# Patient Record
Sex: Male | Born: 1965 | Race: Black or African American | Marital: Single | Smoking: Current every day smoker
Health system: Southern US, Community
[De-identification: ages and names within clinical notes are randomized; demographics above are authoritative.]

## PROBLEM LIST (undated history)

## (undated) DIAGNOSIS — F419 Anxiety disorder, unspecified: Secondary | ICD-10-CM

## (undated) DIAGNOSIS — C61 Malignant neoplasm of prostate: Secondary | ICD-10-CM

## (undated) DIAGNOSIS — H269 Unspecified cataract: Secondary | ICD-10-CM

## (undated) DIAGNOSIS — C801 Malignant (primary) neoplasm, unspecified: Secondary | ICD-10-CM

## (undated) DIAGNOSIS — F1010 Alcohol abuse, uncomplicated: Secondary | ICD-10-CM

## (undated) DIAGNOSIS — F32A Depression, unspecified: Secondary | ICD-10-CM

## (undated) DIAGNOSIS — M1990 Unspecified osteoarthritis, unspecified site: Secondary | ICD-10-CM

## (undated) HISTORY — DX: Unspecified osteoarthritis, unspecified site: M19.90

## (undated) HISTORY — DX: Unspecified cataract: H26.9

## (undated) HISTORY — DX: Malignant neoplasm of prostate: C61

## (undated) HISTORY — PX: APPENDECTOMY: SHX54

## (undated) HISTORY — DX: Alcohol abuse, uncomplicated: F10.10

## (undated) HISTORY — DX: Anxiety disorder, unspecified: F41.9

## (undated) HISTORY — DX: Malignant (primary) neoplasm, unspecified: C80.1

## (undated) HISTORY — PX: HERNIA REPAIR: SHX51

## (undated) HISTORY — DX: Depression, unspecified: F32.A

---

## 2017-05-26 ENCOUNTER — Encounter (HOSPITAL_COMMUNITY): Payer: Self-pay

## 2017-05-28 ENCOUNTER — Other Ambulatory Visit (HOSPITAL_COMMUNITY): Payer: Self-pay | Admitting: Foot & Ankle Surgery

## 2017-05-28 DIAGNOSIS — I739 Peripheral vascular disease, unspecified: Secondary | ICD-10-CM

## 2017-05-29 ENCOUNTER — Ambulatory Visit (HOSPITAL_COMMUNITY)
Admission: RE | Admit: 2017-05-29 | Discharge: 2017-05-29 | Disposition: A | Discharge status: Home / Self Care | Payer: Managed Care, Other (non HMO) | Source: Ambulatory Visit | Attending: Cardiology | Admitting: Cardiology

## 2017-05-29 DIAGNOSIS — I739 Peripheral vascular disease, unspecified: Secondary | ICD-10-CM | POA: Diagnosis not present

## 2017-07-08 ENCOUNTER — Encounter: Payer: Self-pay | Admitting: Cardiovascular Disease

## 2017-07-08 ENCOUNTER — Ambulatory Visit: Payer: Managed Care, Other (non HMO) | Admitting: Cardiovascular Disease

## 2017-07-08 VITALS — BP 148/82 | HR 86 | Ht 71.0 in | Wt 137.0 lb

## 2017-07-08 DIAGNOSIS — M79671 Pain in right foot: Secondary | ICD-10-CM | POA: Diagnosis not present

## 2017-07-08 DIAGNOSIS — M79672 Pain in left foot: Secondary | ICD-10-CM

## 2017-07-08 DIAGNOSIS — Z13220 Encounter for screening for lipoid disorders: Secondary | ICD-10-CM

## 2017-07-08 LAB — HEPATIC FUNCTION PANEL
ALK PHOS: 64 IU/L (ref 39–117)
ALT: 39 IU/L (ref 0–44)
AST: 36 IU/L (ref 0–40)
Albumin: 4.3 g/dL (ref 3.5–5.5)
Bilirubin Total: 0.6 mg/dL (ref 0.0–1.2)
Bilirubin, Direct: 0.2 mg/dL (ref 0.00–0.40)
Total Protein: 7.6 g/dL (ref 6.0–8.5)

## 2017-07-08 LAB — LIPID PANEL
Chol/HDL Ratio: 2.1 ratio (ref 0.0–5.0)
Cholesterol, Total: 210 mg/dL — ABNORMAL HIGH (ref 100–199)
HDL: 100 mg/dL (ref >39)
LDL Calculated: 95 mg/dL (ref 0–99)
TRIGLYCERIDES: 74 mg/dL (ref 0–149)
VLDL Cholesterol Cal: 15 mg/dL (ref 5–40)

## 2017-07-08 NOTE — Progress Notes (Signed)
07/08/2017 Glenn Daniel   [birth year]  [id number]  Primary Physician Patient, No Pcp Per Primary Cardiologist: Lorretta Harp MD Garret Reddish, [place], [place]  HPI:  Glenn Daniel is a 52 y.o. engaged in appearing African-American male father of one daughter, grandfather 3 grandchildren who works as a Glass blower/designer and was referred to me by Dr. Melony Overly , his podiatrist, for foot pain to rule out a peripheral vascular etiology.  He has no cardiac risk factors other than 35-pack-year tobacco abuse.  He does drink 3 shots of liquor at night along with a sixpack of beer.  There is no family history.  He is never had a heart attack or stroke and denies chest pain or shortness of breath.  He does have bilateral foot pain which he attributes to arthritis and plantar fasciitis.  Recent lower extremity arterial Doppler studies performed in the office 06/02/2017 revealed normal ABIs with triphasic waveforms.   No outpatient medications have been marked as taking for the 07/08/17 encounter (Office Visit) with Lorretta Harp, MD.     No Known Allergies  Social History   Socioeconomic History  . Marital status: Single    Spouse name: Not on file  . Number of children: Not on file  . Years of education: Not on file  . Highest education level: Not on file  Occupational History  . Not on file  Social Needs  . Financial resource strain: Not on file  . Food insecurity:    Worry: Not on file    Inability: Not on file  . Transportation needs:    Medical: Not on file    Non-medical: Not on file  Tobacco Use  . Smoking status: Current Every Day Smoker    Packs/day: 0.75    Years: 36.00    Pack years: 27.00    Types: Cigarettes  . Smokeless tobacco: Former Network engineer and Sexual Activity  . Alcohol use: Not on file  . Drug use: Not on file  . Sexual activity: Not on file  Lifestyle  . Physical activity:    Days per week: Not on file    Minutes per session: Not on file  . Stress: Not on  file  Relationships  . Social connections:    Talks on phone: Not on file    Gets together: Not on file    Attends religious service: Not on file    Active member of club or organization: Not on file    Attends meetings of clubs or organizations: Not on file    Relationship status: Not on file  . Intimate partner violence:    Fear of current or ex partner: Not on file    Emotionally abused: Not on file    Physically abused: Not on file    Forced sexual activity: Not on file  Other Topics Concern  . Not on file  Social History Narrative  . Not on file     Review of Systems: General: negative for chills, fever, night sweats or weight changes.  Cardiovascular: negative for chest pain, dyspnea on exertion, edema, orthopnea, palpitations, paroxysmal nocturnal dyspnea or shortness of breath Dermatological: negative for rash Respiratory: negative for cough or wheezing Urologic: negative for hematuria Abdominal: negative for nausea, vomiting, diarrhea, bright red blood per rectum, melena, or hematemesis Neurologic: negative for visual changes, syncope, or dizziness All other systems reviewed and are otherwise negative except as noted above.    Blood pressure (!) 148/82, pulse 86,  height 5\' 11"  (1.803 m), weight 137 lb (62.1 kg).  General appearance: alert and no distress Neck: no adenopathy, no carotid bruit, no JVD, supple, symmetrical, trachea midline and thyroid not enlarged, symmetric, no tenderness/mass/nodules Lungs: clear to auscultation bilaterally Heart: regular rate and rhythm, S1, S2 normal, no murmur, click, rub or gallop Extremities: extremities normal, atraumatic, no cyanosis or edema Pulses: 2+ and symmetric Skin: Skin color, texture, turgor normal. No rashes or lesions Neurologic: Alert and oriented X 3, normal strength and tone. Normal symmetric reflexes. Normal coordination and gait  EKG sinus rhythm 86 with voltage criteria for left ventricular hypertrophy.  I  personally reviewed this EKG.  ASSESSMENT AND PLAN:   Bilateral foot pain Mr. Schwandt was referred to me by Dr. Melony Overly for [facility] evaluation because of bilateral foot pain.  He apparently has arthritis and plantar fasciitis.  He had lower extremity Dopplers performed 06/02/2017 that were entirely normal and he has bounding pedal pulses.  I do not think his pain is vascular in nature.  I will see him back as needed.      Lorretta Harp MD FACP,FACC,FAHA, [facility] 07/08/2017 10:13 AM

## 2017-07-08 NOTE — Assessment & Plan Note (Signed)
Mr. Glenn Daniel was referred to me by Dr. Melony Overly for [facility] evaluation because of bilateral foot pain.  He apparently has arthritis and plantar fasciitis.  He had lower extremity Dopplers performed 06/02/2017 that were entirely normal and he has bounding pedal pulses.  I do not think his pain is vascular in nature.  I will see him back as needed.

## 2017-07-08 NOTE — Patient Instructions (Signed)
Medication Instructions: Your physician recommends that you continue on your current medications as directed. Please refer to the Current Medication list given to you today.  Labwork: Your physician recommends that you return for a FASTING lipid profile and hepatic function panel today.   Follow-Up: Your physician recommends that you schedule a follow-up appointment as needed with Dr. Gwenlyn Found.

## 2017-07-14 ENCOUNTER — Encounter: Payer: Self-pay | Admitting: *Deleted

## 2017-07-15 ENCOUNTER — Encounter: Payer: Self-pay | Admitting: Cardiovascular Disease

## 2017-12-30 ENCOUNTER — Other Ambulatory Visit: Payer: Self-pay | Admitting: Radiation Oncology

## 2017-12-30 DIAGNOSIS — C61 Malignant neoplasm of prostate: Secondary | ICD-10-CM

## 2018-01-11 ENCOUNTER — Ambulatory Visit
Admission: RE | Admit: 2018-01-11 | Discharge: 2018-01-11 | Disposition: A | Discharge status: Home / Self Care | Payer: 59 | Source: Ambulatory Visit | Attending: Radiation Oncology | Admitting: Radiation Oncology

## 2018-01-11 DIAGNOSIS — C61 Malignant neoplasm of prostate: Secondary | ICD-10-CM

## 2018-01-11 MED ORDER — GADOBENATE DIMEGLUMINE 529 MG/ML IV SOLN
15.0000 mL | Freq: Once | INTRAVENOUS | Status: AC | PRN
  Administered 2018-01-11: 15 mL via INTRAVENOUS

## 2019-12-27 IMAGING — MR MR PROSTATE WO/W CM
56 series · 56 of 56 positions shown · IV contrast (Multihance 15ml)
Comparison: None.

CLINICAL DATA: Elevated PSA. Patient reports history of prostate
cancer. No documentation available.

EXAM:
MR PROSTATE WITHOUT AND WITH CONTRAST
TECHNIQUE: Multiplanar multisequence MRI images were obtained of the pelvis
centered about the prostate. Pre and post contrast images were
obtained.
CONTRAST:  15mL MULTIHANCE GADOBENATE DIMEGLUMINE 529 MG/ML IV SOLN

[Series 3: T1 · axial · 8.0mm · 1.06mm/px · 1 of 32 slices shown (1 of 2)]
[im 1/32]
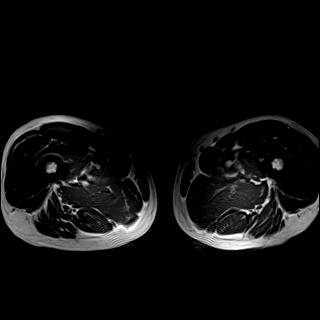

[Series 4: bSSFP fat-sat · axial · 8.0mm · 0.74mm/px · 1 of 32 slices shown]
[im 1/32]
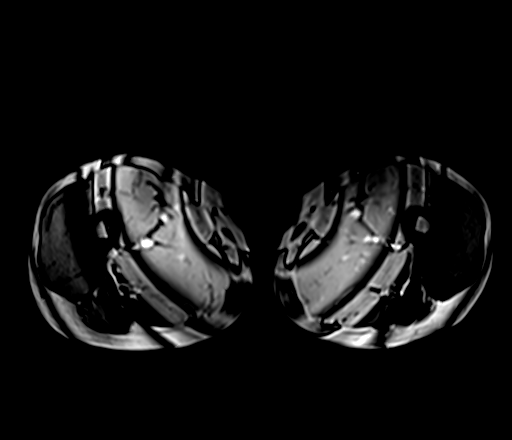

[Series 5: T2 · sagittal · 3.5mm · 0.56mm/px · 1 of 45 slices shown (1 of 4)]
[im 1/45]
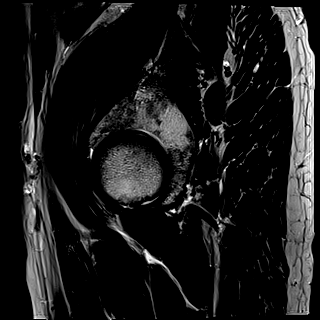

[Series 7: T1 · axial · 3.0mm · 0.31mm/px · 1 of 42 slices shown (2 of 2)]
[im 1/42]
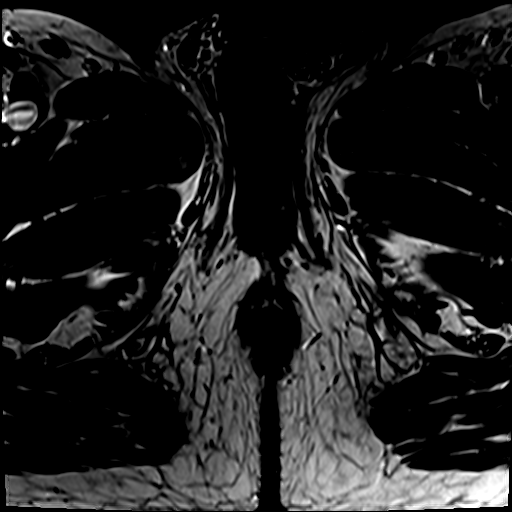

[Series 8: T2 · axial · 3.0mm · 0.56mm/px · 1 of 42 slices shown (2 of 4)]
[im 1/42]
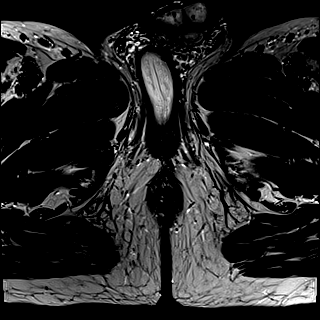

[Series 9: T2 · axial · 1.0mm · 1.04mm/px · 1 of 112 slices shown (3 of 4)]
[im 1/112]
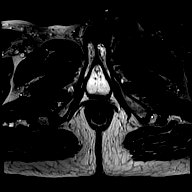

[Series 10: T2 · coronal · 3.5mm · 0.56mm/px · 1 of 45 slices shown (4 of 4)]
[im 1/45]
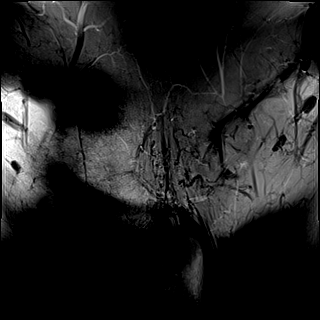

[Series 11: DWI · axial · 3.5mm · 1.56mm/px · 1 of 89 slices shown (1 of 2)]
[im 1/89]
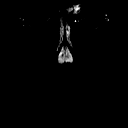

[Series 12: DWI · axial · 3.5mm · 1.56mm/px · 1 of 30 slices shown (2 of 2)]
[im 1/30]
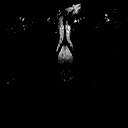

[Series 13: pre t1_twist_tra_dyn_ttc=8.1s · axial · non-contrast · 3.5mm · 0.83mm/px · 1 of 30 slices shown]
[im 1/30]
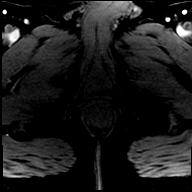

[Series 14: post t1_twist_tra_dyn-copy center · axial · 3.5mm · 0.83mm/px · 1 of 30 slices shown (1 of 24)]
[im 1/30]
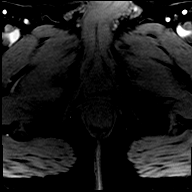

[Series 15: post t1_twist_tra_dyn-copy center · axial · 3.5mm · 0.83mm/px · 1 of 30 slices shown (2 of 24)]
[im 1/30]
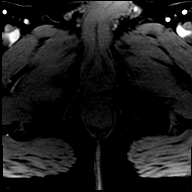

[Series 16: post t1_twist_tra_dyn-copy cent_sub_ttc=(id) · axial · 3.5mm · 0.83mm/px · 1 of 30 slices shown (1 of 22)]
[im 1/30]
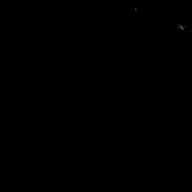

[Series 17: post t1_twist_tra_dyn-copy center · axial · 3.5mm · 0.83mm/px · 1 of 30 slices shown (3 of 24)]
[im 1/30]
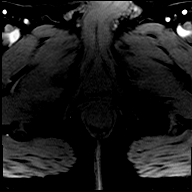

[Series 18: post t1_twist_tra_dyn-copy cent_sub_ttc=(id) · axial · 3.5mm · 0.83mm/px · 1 of 30 slices shown (2 of 22)]
[im 1/30]
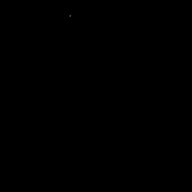

[Series 19: post t1_twist_tra_dyn-copy center · axial · 3.5mm · 0.83mm/px · 1 of 30 slices shown (4 of 24)]
[im 1/30]
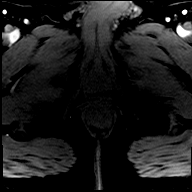

[Series 20: post t1_twist_tra_dyn-copy cent_sub_ttc=(id) · axial · 3.5mm · 0.83mm/px · 1 of 30 slices shown (3 of 22)]
[im 1/30]
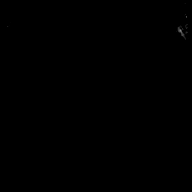

[Series 21: post t1_twist_tra_dyn-copy center · axial · 3.5mm · 0.83mm/px · 1 of 30 slices shown (5 of 24)]
[im 1/30]
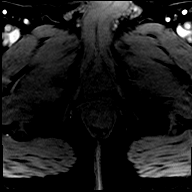

[Series 22: post t1_twist_tra_dyn-copy cent_sub_ttc=(id) · axial · 3.5mm · 0.83mm/px · 1 of 30 slices shown (4 of 22)]
[im 1/30]
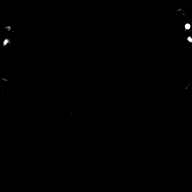

[Series 23: post t1_twist_tra_dyn-copy center · axial · 3.5mm · 0.83mm/px · 1 of 30 slices shown (6 of 24)]
[im 1/30]
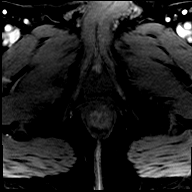

[Series 24: post t1_twist_tra_dyn-copy cent_sub_ttc=(id) · axial · 3.5mm · 0.83mm/px · 1 of 30 slices shown (5 of 22)]
[im 1/30]
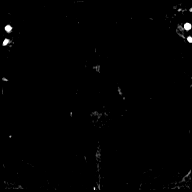

[Series 25: post t1_twist_tra_dyn-copy center · axial · 3.5mm · 0.83mm/px · 1 of 30 slices shown (7 of 24)]
[im 1/30]
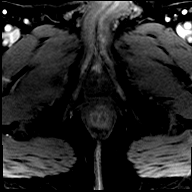

[Series 26: post t1_twist_tra_dyn-copy cent_sub_ttc=(id) · axial · 3.5mm · 0.83mm/px · 1 of 30 slices shown (6 of 22)]
[im 1/30]
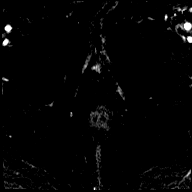

[Series 27: post t1_twist_tra_dyn-copy center · axial · 3.5mm · 0.83mm/px · 1 of 30 slices shown (8 of 24)]
[im 1/30]
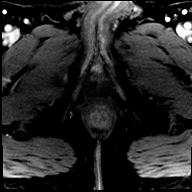

[Series 28: post t1_twist_tra_dyn-copy cent_sub_ttc=(id) · axial · 3.5mm · 0.83mm/px · 1 of 30 slices shown (7 of 22)]
[im 1/30]
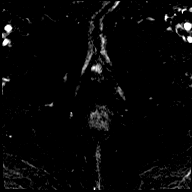

[Series 29: post t1_twist_tra_dyn-copy center · axial · 3.5mm · 0.83mm/px · 1 of 30 slices shown (9 of 24)]
[im 1/30]
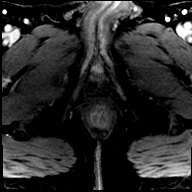

[Series 30: post t1_twist_tra_dyn-copy cent_sub_ttc=(id) · axial · 3.5mm · 0.83mm/px · 1 of 30 slices shown (8 of 22)]
[im 1/30]
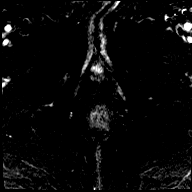

[Series 31: post t1_twist_tra_dyn-copy center · axial · 3.5mm · 0.83mm/px · 1 of 30 slices shown (10 of 24)]
[im 1/30]
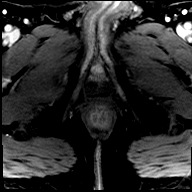

[Series 32: post t1_twist_tra_dyn-copy cent_sub_ttc=(id) · axial · 3.5mm · 0.83mm/px · 1 of 30 slices shown (9 of 22)]
[im 1/30]
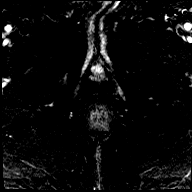

[Series 33: post t1_twist_tra_dyn-copy center · axial · 3.5mm · 0.83mm/px · 1 of 30 slices shown (11 of 24)]
[im 1/30]
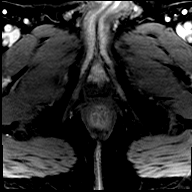

[Series 34: post t1_twist_tra_dyn-copy cent_sub_ttc=(id) · axial · 3.5mm · 0.83mm/px · 1 of 30 slices shown (10 of 22)]
[im 1/30]
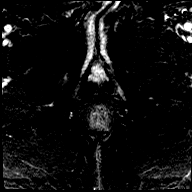

[Series 35: post t1_twist_tra_dyn-copy center · axial · 3.5mm · 0.83mm/px · 1 of 30 slices shown (12 of 24)]
[im 1/30]
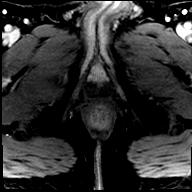

[Series 36: post t1_twist_tra_dyn-copy cent_sub_ttc=(id) · axial · 3.5mm · 0.83mm/px · 1 of 30 slices shown (11 of 22)]
[im 1/30]
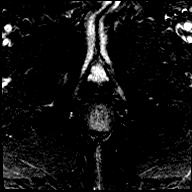

[Series 37: post t1_twist_tra_dyn-copy center · axial · 3.5mm · 0.83mm/px · 1 of 30 slices shown (13 of 24)]
[im 1/30]
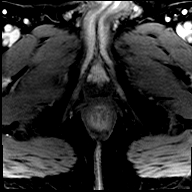

[Series 38: post t1_twist_tra_dyn-copy cent_sub_ttc=(id) · axial · 3.5mm · 0.83mm/px · 1 of 30 slices shown (12 of 22)]
[im 1/30]
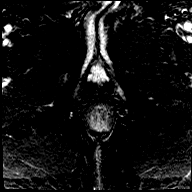

[Series 39: post t1_twist_tra_dyn-copy center · axial · 3.5mm · 0.83mm/px · 1 of 30 slices shown (14 of 24)]
[im 1/30]
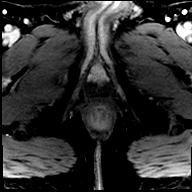

[Series 40: post t1_twist_tra_dyn-copy cent_sub_ttc=(id) · axial · 3.5mm · 0.83mm/px · 1 of 30 slices shown (13 of 22)]
[im 1/30]
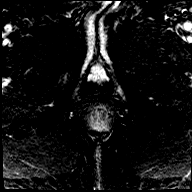

[Series 41: post t1_twist_tra_dyn-copy center · axial · 3.5mm · 0.83mm/px · 1 of 30 slices shown (15 of 24)]
[im 1/30]
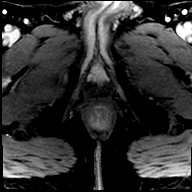

[Series 42: post t1_twist_tra_dyn-copy cent_sub_ttc=(id) · axial · 3.5mm · 0.83mm/px · 1 of 30 slices shown (14 of 22)]
[im 1/30]
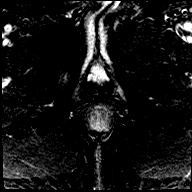

[Series 43: post t1_twist_tra_dyn-copy center · axial · 3.5mm · 0.83mm/px · 1 of 30 slices shown (16 of 24)]
[im 1/30]
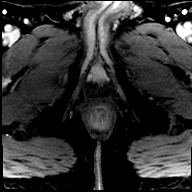

[Series 44: post t1_twist_tra_dyn-copy cent_sub_ttc=(id) · axial · 3.5mm · 0.83mm/px · 1 of 30 slices shown (15 of 22)]
[im 1/30]
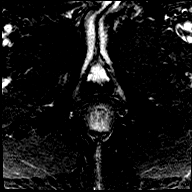

[Series 45: post t1_twist_tra_dyn-copy center · axial · 3.5mm · 0.83mm/px · 1 of 30 slices shown (17 of 24)]
[im 1/30]
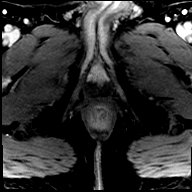

[Series 46: post t1_twist_tra_dyn-copy cent_sub_ttc=(id) · axial · 3.5mm · 0.83mm/px · 1 of 30 slices shown (16 of 22)]
[im 1/30]
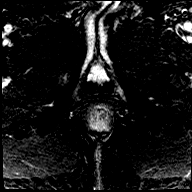

[Series 47: post t1_twist_tra_dyn-copy center · axial · 3.5mm · 0.83mm/px · 1 of 30 slices shown (18 of 24)]
[im 1/30]
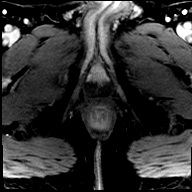

[Series 48: post t1_twist_tra_dyn-copy cent_sub_ttc=(id) · axial · 3.5mm · 0.83mm/px · 1 of 30 slices shown (17 of 22)]
[im 1/30]
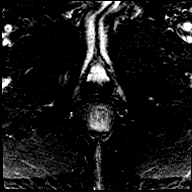

[Series 49: post t1_twist_tra_dyn-copy center · axial · 3.5mm · 0.83mm/px · 1 of 30 slices shown (19 of 24)]
[im 1/30]
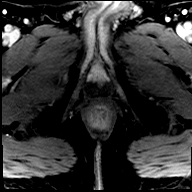

[Series 50: post t1_twist_tra_dyn-copy cent_sub_ttc=(id) · axial · 3.5mm · 0.83mm/px · 1 of 30 slices shown (18 of 22)]
[im 1/30]
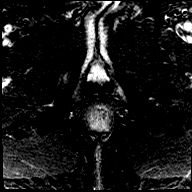

[Series 51: post t1_twist_tra_dyn-copy center · axial · 3.5mm · 0.83mm/px · 1 of 30 slices shown (20 of 24)]
[im 1/30]
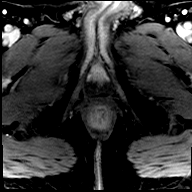

[Series 52: post t1_twist_tra_dyn-copy cent_sub_ttc=(id) · axial · 3.5mm · 0.83mm/px · 1 of 30 slices shown (19 of 22)]
[im 1/30]
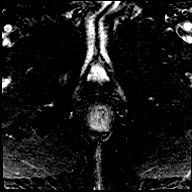

[Series 53: post t1_twist_tra_dyn-copy center · axial · 3.5mm · 0.83mm/px · 1 of 30 slices shown (21 of 24)]
[im 1/30]
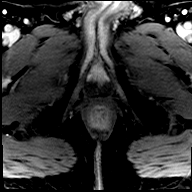

[Series 54: post t1_twist_tra_dyn-copy cent_sub_ttc=(id) · axial · 3.5mm · 0.83mm/px · 1 of 30 slices shown (20 of 22)]
[im 1/30]
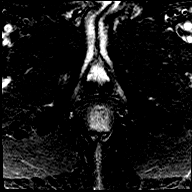

[Series 55: post t1_twist_tra_dyn-copy center · axial · 3.5mm · 0.83mm/px · 1 of 30 slices shown (22 of 24)]
[im 1/30]
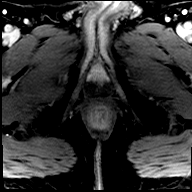

[Series 56: post t1_twist_tra_dyn-copy cent_sub_ttc=(id) · axial · 3.5mm · 0.83mm/px · 1 of 30 slices shown (21 of 22)]
[im 1/30]
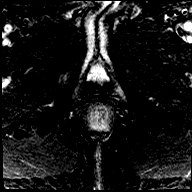

[Series 57: post t1_twist_tra_dyn-copy center · axial · 3.5mm · 0.83mm/px · 1 of 30 slices shown (23 of 24)]
[im 1/30]
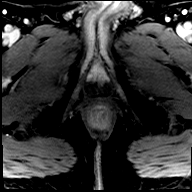

[Series 58: post t1_twist_tra_dyn-copy cent_sub_ttc=(id) · axial · 3.5mm · 0.83mm/px · 1 of 30 slices shown (22 of 22)]
[im 1/30]
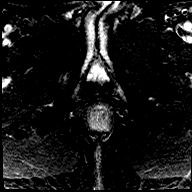

[Series 59: post t1_twist_tra_dyn-copy center · axial · 3.5mm · 0.83mm/px · 1 of 30 slices shown (24 of 24)]
[im 1/30]
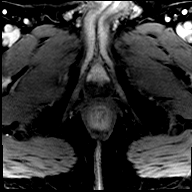

[56 of 56 positions shown; findings below may reference images not displayed]

FINDINGS: Prostate: No foci of restricted diffusion within the peripheral
zone. Heterogeneous signal intensity within peripheral zone on T2
weighted imaging without focal lesion. No suspicious enhancement
pattern on postcontrast imaging.

The transitional zone is relatively small with capsulated nodules.

Seminal vesicles are normal.  Prostatic capsule intact.

Volume: 4.4 x 2.5 x 4.3 cm (volume = 25 cm^3)

Transcapsular spread:  Absent

Seminal vesicle involvement: Absent

Neurovascular bundle involvement: Absent

Pelvic adenopathy: Absent

Bone metastasis: Absent

Other findings: None
IMPRESSION: 1. No high-grade carcinoma within the peripheral zone.
2. Mildly nodular transitional zone.

## 2021-07-24 ENCOUNTER — Encounter: Payer: Self-pay | Admitting: Physician Assistant

## 2021-08-09 ENCOUNTER — Encounter: Payer: Self-pay | Admitting: *Deleted

## 2021-08-14 NOTE — Progress Notes (Signed)
08/16/2021 Glenn Daniel [id number] 1965-09-16  Referring provider: Dr. Jannette Fogo  Primary GI doctor: Dr. Loletha Carrow  ASSESSMENT AND PLAN:   Alternating constipation and diarrhea with some BRB rare, small volume Will schedule for colonoscopy We have discussed the risks of bleeding, infection, perforation, medication reactions, and remote risk of death associated with colonoscopy. All questions were answered and the patient acknowledges these risk and wishes to proceed.  Gastroesophageal reflux disease without esophagitis Rare but with alcohol abuse and GERD with some "choking"  Will schedule EGD to evaluate for gastritis esophagitis varices.  I discussed risks of EGD with patient today, including risk of sedation, bleeding or perforation.  Patient provides understanding and gave verbal consent to proceed.  Alcohol abuse Will get labs to evaluate LFTs/CBC Counseled on ETOH cessation, informaiton given May benefit from RUQ [place]  Patient Care Team: [facility] as PCP - General (Pediatrics)  HISTORY OF PRESENT ILLNESS: 56 y.o. male with a past medical history of alcohol abuse, smoker, arthritis, prostate cancer a/p radiation therapy 2019, CKD stage 3, and others listed below presents for evaluation of constipation and discuss colonoscopy.   Has never had colonoscopy.  No known GI malignancy that he knows of.  He has intermittent constipation/diarrhea, noticed changes in bowel's after radiation treatment for prostate cancer 2019.  Has BM daily or up to 3-4 x a day but will have straining/hard stools.  Right after treatment was having fecal incontinence, this has improved some but still can have some stool with urinating.  He has BRB on TP and in toilet with straining.  He has choking during the day and at night, states it is form his left ear and has cough.  He has reflux.  He has a lot of bloating/gas, no swelling in AB or legs.  He drinks to sleep for  years, 1/2 a pint, no history of withdraws. No family history of cirrhosis.  He has lost about 30 lbs but weight is same from 2019, patient states recently weight 160.   Wt Readings from Last 3 Encounters:  08/16/21 136 lb (61.7 kg)  07/08/17 137 lb (62.1 kg)     Current Medications:   Current Outpatient Medications (Endocrine & Metabolic):    dapagliflozin propanediol (FARXIGA) 10 MG TABS tablet, Take by mouth daily.  Current Outpatient Medications (Cardiovascular):    tadalafil (CIALIS) 20 MG tablet, Take 20 mg by mouth daily.  Current Outpatient Medications (Respiratory):    diphenhydrAMINE (BENADRYL) 25 MG tablet, Take 25 mg by mouth every 6 (six) hours as needed.   Current Outpatient Medications (Hematological):    folic acid (FOLVITE) 426 MCG tablet, Take 400 mcg by mouth daily.  Current Outpatient Medications (Other):    AMBULATORY NON FORMULARY MEDICATION, Take 1 capsule by mouth daily. One a day men   ergocalciferol (VITAMIN D2) 1.25 MG (50000 UT) capsule, Take 50,000 Units by mouth once a week.   NON FORMULARY, Take 1 1e11 Vector Genomes by mouth 2 (two) times daily. Enteresto 24 mg  Medical History:  Past Medical History:  Diagnosis Date   Alcohol abuse    Arthritis    Cancer ([place])    Cataract    Depression    Prostate cancer ([place])    Allergies: No Known Allergies   Surgical History:  He  has a past surgical history that includes Appendectomy and Hernia repair. Family History:  His family history includes Diabetes in his father; Prostate cancer in his brother. Social  History:   reports that he has been smoking cigarettes. He has a 27.00 pack-year smoking history. He has quit using smokeless tobacco. No history on file for alcohol use and drug use.  REVIEW OF SYSTEMS  : All other systems reviewed and negative except where noted in the History of Present Illness.   PHYSICAL EXAM: BP 126/70   Pulse (!) 105   Ht '5\' 11"'$  (1.803 m)   Wt 136 lb (61.7 kg)    BMI 18.97 kg/m  General:   Pleasant, skinny male in no acute distress Head:   Normocephalic and atraumatic. Eyes:  sclerae anicteric,conjunctive pink  Heart:   regular rate and rhythm Pulm:  Clear anteriorly; no wheezing Abdomen:   Soft, Flat AB, Active bowel sounds. mild tenderness in the epigastrium. Without guarding and Without rebound, No organomegaly appreciated. Rectal: Not evaluated Extremities:  Without edema. Msk: Symmetrical without gross deformities. Peripheral pulses intact.  Neurologic:  Alert and  oriented x4;  No focal deficits.  Skin:   Dry and intact without significant lesions or rashes. Psychiatric:  Cooperative. Normal mood and affect.    Vladimir Crofts, PA-C 3:04 PM

## 2021-08-16 ENCOUNTER — Ambulatory Visit (INDEPENDENT_AMBULATORY_CARE_PROVIDER_SITE_OTHER): Payer: 59 | Admitting: Physician Assistant

## 2021-08-16 ENCOUNTER — Other Ambulatory Visit (INDEPENDENT_AMBULATORY_CARE_PROVIDER_SITE_OTHER): Payer: 59

## 2021-08-16 ENCOUNTER — Encounter: Payer: Self-pay | Admitting: Physician Assistant

## 2021-08-16 VITALS — BP 126/70 | HR 105 | Ht 71.0 in | Wt 136.0 lb

## 2021-08-16 DIAGNOSIS — K219 Gastro-esophageal reflux disease without esophagitis: Secondary | ICD-10-CM | POA: Diagnosis not present

## 2021-08-16 DIAGNOSIS — K625 Hemorrhage of anus and rectum: Secondary | ICD-10-CM

## 2021-08-16 DIAGNOSIS — R198 Other specified symptoms and signs involving the digestive system and abdomen: Secondary | ICD-10-CM | POA: Diagnosis not present

## 2021-08-16 DIAGNOSIS — F1010 Alcohol abuse, uncomplicated: Secondary | ICD-10-CM | POA: Diagnosis not present

## 2021-08-16 LAB — COMPREHENSIVE METABOLIC PANEL
ALT: 33 U/L (ref 0–53)
AST: 31 U/L (ref 0–37)
Albumin: 3.3 g/dL — ABNORMAL LOW (ref 3.5–5.2)
Alkaline Phosphatase: 59 U/L (ref 39–117)
BUN: 12 mg/dL (ref 6–23)
CO2: 26 mEq/L (ref 19–32)
Calcium: 8.9 mg/dL (ref 8.4–10.5)
Chloride: 106 mEq/L (ref 96–112)
Creatinine, Ser: 1.13 mg/dL (ref 0.40–1.50)
GFR: 72.53 mL/min (ref >60.00)
Glucose, Bld: 104 mg/dL — ABNORMAL HIGH (ref 70–99)
Potassium: 4 mEq/L (ref 3.5–5.1)
Sodium: 140 mEq/L (ref 135–145)
Total Bilirubin: 0.5 mg/dL (ref 0.2–1.2)
Total Protein: 7 g/dL (ref 6.0–8.3)

## 2021-08-16 LAB — CBC WITH DIFFERENTIAL/PLATELET
Basophils Absolute: 0 10*3/uL (ref 0.0–0.1)
Basophils Relative: 1 % (ref 0.0–3.0)
Eosinophils Absolute: 0 10*3/uL (ref 0.0–0.7)
Eosinophils Relative: 0.7 % (ref 0.0–5.0)
HCT: 37.1 % — ABNORMAL LOW (ref 39.0–52.0)
Hemoglobin: 12.5 g/dL — ABNORMAL LOW (ref 13.0–17.0)
Lymphocytes Relative: 31.7 % (ref 12.0–46.0)
Lymphs Abs: 1 10*3/uL (ref 0.7–4.0)
MCHC: 33.7 g/dL (ref 30.0–36.0)
MCV: 103.3 fl — ABNORMAL HIGH (ref 78.0–100.0)
Monocytes Absolute: 0.4 10*3/uL (ref 0.1–1.0)
Monocytes Relative: 13.2 % — ABNORMAL HIGH (ref 3.0–12.0)
Neutro Abs: 1.6 10*3/uL (ref 1.4–7.7)
Neutrophils Relative %: 53.4 % (ref 43.0–77.0)
Platelets: 191 10*3/uL (ref 150.0–400.0)
RBC: 3.59 Mil/uL — ABNORMAL LOW (ref 4.22–5.81)
RDW: 14 % (ref 11.5–15.5)
WBC: 3.1 10*3/uL — ABNORMAL LOW (ref 4.0–10.5)

## 2021-08-16 MED ORDER — SUPREP BOWEL PREP KIT 17.5-3.13-1.6 GM/177ML PO SOLN: 1.0000 | ORAL | 0 refills | Status: DC

## 2021-08-16 NOTE — Patient Instructions (Addendum)
You have been scheduled for an endoscopy and colonoscopy. Please follow the written instructions given to you at your visit today. Please pick up your prep supplies at the pharmacy within the next 1-3 days. If you use inhalers (even only as needed), please bring them with you on the day of your procedure.  Your provider has requested that you go to the basement level for lab work before leaving today. Press "B" on the elevator. The lab is located at the first door on the left as you exit the elevator.  If you are age 66 or older, your body mass index should be between 23-30. Your Body mass index is 18.97 kg/m. If this is out of the aforementioned range listed, please consider follow up with your Primary Care Provider.  If you are age 58 or younger, your body mass index should be between 19-25. Your Body mass index is 18.97 kg/m. If this is out of the aformentioned range listed, please consider follow up with your Primary Care Provider.   ________________________________________________________  The [facility] providers would like to encourage you to use [facility] to communicate with providers for non-urgent requests or questions.  Due to long hold times on the telephone, sending your provider a message by [facility] may be a faster and more efficient way to get a response.  Please allow 48 business hours for a response.  Please remember that this is for non-urgent requests.  _______________________________________________________  Due to recent changes in healthcare laws, you may see the results of your imaging and laboratory studies on MyChart before your provider has had a chance to review them.  We understand that in some cases there may be results that are confusing or concerning to you. Not all laboratory results come back in the same time frame and the provider may be waiting for multiple results in order to interpret others.  Please give [place] 48 hours in order for your provider to thoroughly review  all the results before contacting the office for clarification of your results.  ________________________________________________________  FIBER SUPPLEMENT You can do metamucil or fibercon once or twice a day but if this causes gas/bloating please switch to Benefiber or Citracel.  Fiber is good for constipation/diarrhea/irritable bowel syndrome.  It can also help with weight loss and can help lower your bad cholesterol.  Please do 1 TBSP in the morning in water, coffee, or tea. It can take up to a month before you can see a difference with your bowel movements.  It is cheapest from [employer], [employer], [employer].   Take with fiber with with a full 8 oz glass of water once a day. This can take 1 month to start helping, so try for at least one month.  Recommend increasing water and activity.  Get a squatty potty to use at home or try a stool, goal is to get your knees above your hips during a bowel movement.  - Drink at least 64-80 ounces of water/liquid per day. - Establish a time to try to move your bowels every day.  For many people, this is after a cup of coffee or after a meal such as breakfast. - Sit all of the way back on the toilet keeping your back fairly straight and while sitting up, try to rest the tops of your forearms on your upper thighs.   - Raising your feet with a step stool/squatty potty can be helpful to improve the angle that allows your stool to pass through the rectum. -  Relax the rectum feeling it bulge toward the toilet water.  If you feel your rectum raising toward your body, you are contracting rather than relaxing. - Breathe in and slowly exhale. "Belly breath" by expanding your belly towards your belly button. Keep belly expanded as you gently direct pressure down and back to the anus.  A low pitched GRRR sound can assist with increasing intra-abdominal pressure.  - Repeat 3-4 times. If unsuccessful, contract the pelvic floor to restore normal tone and get off the toilet.   Avoid excessive straining. - To reduce excessive wiping by teaching your anus to normally contract, place hands on outer aspect of knees and resist knee movement outward.  Hold 5-10 second then place hands just inside of knees and resist inward movement of knees.  Hold 5 seconds.  Repeat a few times each way.  Go to the ER if unable to pass gas, severe AB pain, unable to hold down food, any shortness of breath of chest pain.    FODMAP stands for fermentable oligo-, di-, mono-saccharides and polyols (1). These are the scientific terms used to classify groups of carbs that are notorious for triggering digestive symptoms like bloating, gas and stomach pain.   FODMAPs are found in a wide range of foods in varying amounts. Some foods contain just one type, while others contain several.  The main dietary sources of the four groups of FODMAPs include:  Oligosaccharides: Wheat, rye, legumes and various fruits and vegetables, such as garlic and onions.  Disaccharides: Milk, yogurt and soft cheese. Lactose is the main carb.  Monosaccharides: Various fruit including figs and mangoes, and sweeteners such as honey and agave nectar. Fructose is the main carb.  Polyols: Certain fruits and vegetables including blackberries and lychee, as well as some low-calorie sweeteners like those in sugar-free gum.   Keep a food diary. This will help you identify foods that cause symptoms. Write down: What you eat and when. What symptoms you have. When symptoms occur in relation to your meals. Avoid foods that cause symptoms. Talk with your dietitian about other ways to get the same nutrients that are in these foods. Eat your meals slowly, in a relaxed setting. Aim to eat 5-6 small meals per day. Do not skip meals. Drink enough fluids to keep your urine clear or pale yellow. If dairy products cause your symptoms to flare up, try eating less of them. You might be able to handle yogurt better than other dairy products  because it contains bacteria that help with digestion.

## 2021-08-19 ENCOUNTER — Other Ambulatory Visit: Payer: Self-pay

## 2021-08-19 DIAGNOSIS — D539 Nutritional anemia, unspecified: Secondary | ICD-10-CM

## 2021-08-19 NOTE — Progress Notes (Signed)
____________________________________________________________  Attending physician addendum:  Thank you for sending this case to me. I have reviewed the entire note and agree with the plan.   Coy Vandoren Danis, MD  ____________________________________________________________  

## 2021-08-21 ENCOUNTER — Other Ambulatory Visit (INDEPENDENT_AMBULATORY_CARE_PROVIDER_SITE_OTHER): Payer: 59

## 2021-08-21 DIAGNOSIS — D539 Nutritional anemia, unspecified: Secondary | ICD-10-CM

## 2021-08-21 DIAGNOSIS — R7989 Other specified abnormal findings of blood chemistry: Secondary | ICD-10-CM

## 2021-08-21 LAB — IBC + FERRITIN
Ferritin: >1500 ng/mL — ABNORMAL HIGH (ref 22.0–322.0)
Iron: 198 ug/dL — ABNORMAL HIGH (ref 42–165)
Saturation Ratios: 89.5 % — ABNORMAL HIGH (ref 20.0–50.0)
TIBC: 221.2 ug/dL — ABNORMAL LOW (ref 250.0–450.0)
Transferrin: 158 mg/dL — ABNORMAL LOW (ref 212.0–360.0)

## 2021-08-21 LAB — VITAMIN B12: Vitamin B-12: 842 pg/mL (ref 211–911)

## 2021-08-23 ENCOUNTER — Other Ambulatory Visit: Payer: Self-pay

## 2021-08-28 ENCOUNTER — Other Ambulatory Visit: Payer: 59

## 2021-08-28 DIAGNOSIS — R7989 Other specified abnormal findings of blood chemistry: Secondary | ICD-10-CM

## 2021-09-05 LAB — HEMOCHROMATOSIS DNA-PCR(C282Y,H63D)

## 2021-09-29 ENCOUNTER — Encounter: Payer: Self-pay | Admitting: Certified Registered Nurse Anesthetist

## 2021-10-01 ENCOUNTER — Encounter: Payer: Self-pay | Admitting: Gastroenterology

## 2021-10-01 ENCOUNTER — Ambulatory Visit (AMBULATORY_SURGERY_CENTER): Payer: 59 | Admitting: Gastroenterology

## 2021-10-01 VITALS — BP 130/79 | HR 76 | Temp 97.1°F | Resp 20 | Ht 71.0 in | Wt 136.0 lb

## 2021-10-01 DIAGNOSIS — K319 Disease of stomach and duodenum, unspecified: Secondary | ICD-10-CM | POA: Diagnosis not present

## 2021-10-01 DIAGNOSIS — D122 Benign neoplasm of ascending colon: Secondary | ICD-10-CM

## 2021-10-01 DIAGNOSIS — D123 Benign neoplasm of transverse colon: Secondary | ICD-10-CM | POA: Diagnosis not present

## 2021-10-01 DIAGNOSIS — K2960 Other gastritis without bleeding: Secondary | ICD-10-CM | POA: Diagnosis not present

## 2021-10-01 DIAGNOSIS — R110 Nausea: Secondary | ICD-10-CM | POA: Diagnosis not present

## 2021-10-01 DIAGNOSIS — D120 Benign neoplasm of cecum: Secondary | ICD-10-CM

## 2021-10-01 DIAGNOSIS — K625 Hemorrhage of anus and rectum: Secondary | ICD-10-CM | POA: Diagnosis not present

## 2021-10-01 DIAGNOSIS — R1084 Generalized abdominal pain: Secondary | ICD-10-CM

## 2021-10-01 DIAGNOSIS — K219 Gastro-esophageal reflux disease without esophagitis: Secondary | ICD-10-CM

## 2021-10-01 DIAGNOSIS — K635 Polyp of colon: Secondary | ICD-10-CM | POA: Diagnosis not present

## 2021-10-01 MED ORDER — SODIUM CHLORIDE 0.9 % IV SOLN: 500.0000 mL | Freq: Once | INTRAVENOUS | Status: DC

## 2021-10-01 NOTE — Progress Notes (Signed)
2060 Robinul 0.1 mg IV given due large amount of secretions upon assessment.  MD made aware, vss

## 2021-10-01 NOTE — Op Note (Signed)
[name] Patient Name: Glenn Daniel Procedure Date: 10/01/2021 8:31 AM MRN: [id number] Endoscopist: Mallie Mussel L. Loletha Carrow , MD Age: 56 Referring MD:  Date of Birth: 1966/01/28 Gender: Male Account #: [id number] Procedure:                Upper GI endoscopy Indications:              Abdominal bloating, Nausea Medicines:                Monitored Anesthesia Care Procedure:                Pre-Anesthesia Assessment:                           - Prior to the procedure, a History and Physical                            was performed, and patient medications and                            allergies were reviewed. The patient's tolerance of                            previous anesthesia was also reviewed. The risks                            and benefits of the procedure and the sedation                            options and risks were discussed with the patient.                            All questions were answered, and informed consent                            was obtained. Prior Anticoagulants: The patient has                            taken no previous anticoagulant or antiplatelet                            agents. ASA Grade Assessment: II - A patient with                            mild systemic disease. After reviewing the risks                            and benefits, the patient was deemed in                            satisfactory condition to undergo the procedure.                           After obtaining informed consent, the endoscope was  passed under direct vision. Throughout the                            procedure, the patient's blood pressure, pulse, and                            oxygen saturations were monitored continuously. The                            GIF [id number] #[id number] was introduced through the                            mouth, and advanced to the second part of duodenum.                            The upper GI endoscopy was  accomplished without                            difficulty. The patient tolerated the procedure                            well. Scope In: Scope Out: Findings:                 The esophagus was normal.                           Diffuse congested mucosa was found in the gastric                            fundus and in the gastric body. Several biopsies                            were obtained in the gastric body and in the                            gastric antrum with cold forceps for histology.                           The cardia and gastric fundus were normal on                            retroflexion.                           The examined duodenum was normal. Complications:            No immediate complications. Estimated Blood Loss:     Estimated blood loss was minimal. Impression:               - Normal esophagus.                           - Congestive gastropathy.                           - Normal examined duodenum.                           -  Several biopsies were obtained in the gastric                            body and in the gastric antrum. Recommendation:           - Patient has a contact number available for                            emergencies. The signs and symptoms of potential                            delayed complications were discussed with the                            patient. Return to normal activities tomorrow.                            Written discharge instructions were provided to the                            patient.                           - Resume previous diet.                           - Continue present medications.                           - Await pathology results.                           - See the other procedure note for documentation of                            additional recommendations.                           - Decrease alcohol consumption. Cassady Stanczak L. Loletha Carrow, MD 10/01/2021 9:16:57 AM This report has been signed  electronically.

## 2021-10-01 NOTE — Progress Notes (Signed)
Called to room to assist during endoscopic procedure.  Patient ID and intended procedure confirmed with present staff. Received instructions for my participation in the procedure from the performing physician.  

## 2021-10-01 NOTE — Patient Instructions (Signed)
Please read handouts provided. Continue present medications. Await pathology results. Decrease alcohol consumption. Increase dietary fiber.   YOU HAD AN ENDOSCOPIC PROCEDURE TODAY AT [facility]:   Refer to the procedure report that was given to you for any specific questions about what was found during the examination.  If the procedure report does not answer your questions, please call your gastroenterologist to clarify.  If you requested that your care partner not be given the details of your procedure findings, then the procedure report has been included in a sealed envelope for you to review at your convenience later.  YOU SHOULD EXPECT: Some feelings of bloating in the abdomen. Passage of more gas than usual.  Walking can help get rid of the air that was put into your GI tract during the procedure and reduce the bloating. If you had a lower endoscopy (such as a colonoscopy or flexible sigmoidoscopy) you may notice spotting of blood in your stool or on the toilet paper. If you underwent a bowel prep for your procedure, you may not have a normal bowel movement for a few days.  Please Note:  You might notice some irritation and congestion in your nose or some drainage.  This is from the oxygen used during your procedure.  There is no need for concern and it should clear up in a day or so.  SYMPTOMS TO REPORT IMMEDIATELY:  Following lower endoscopy (colonoscopy or flexible sigmoidoscopy):  Excessive amounts of blood in the stool  Significant tenderness or worsening of abdominal pains  Swelling of the abdomen that is new, acute  Fever of 100F or higher  Following upper endoscopy (EGD)  Vomiting of blood or coffee ground material  New chest pain or pain under the shoulder blades  Painful or persistently difficult swallowing  New shortness of breath  Fever of 100F or higher  Black, tarry-looking stools  For urgent or emergent issues, a gastroenterologist can be reached  at any hour by calling [phone]. Do not use MyChart messaging for urgent concerns.    DIET:  We do recommend a small meal at first, but then you may proceed to your regular diet.  Drink plenty of fluids but you should avoid alcoholic beverages for 24 hours.  ACTIVITY:  You should plan to take it easy for the rest of today and you should NOT DRIVE or use heavy machinery until tomorrow (because of the sedation medicines used during the test).    FOLLOW UP: Our staff will call the number listed on your records the next business day following your procedure.  We will call around 7:15- 8:00 am to check on you and address any questions or concerns that you may have regarding the information given to you following your procedure. If we do not reach you, we will leave a message.  If you develop any symptoms (ie: fever, flu-like symptoms, shortness of breath, cough etc.) before then, please call [phone].  If you test positive for Covid 19 in the 2 weeks post procedure, please call and report this information to [place].    If any biopsies were taken you will be contacted by phone or by letter within the next 1-3 weeks.  Please call us at [phone] if you have not heard about the biopsies in 3 weeks.    SIGNATURES/CONFIDENTIALITY: You and/or your care partner have signed paperwork which will be entered into your electronic medical record.  These signatures attest to the fact that that the information  above on your After Visit Summary has been reviewed and is understood.  Full responsibility of the confidentiality of this discharge information lies with you and/or your care-partner.  

## 2021-10-01 NOTE — Op Note (Signed)
[facility] Patient Name: Glenn Daniel Procedure Date: 10/01/2021 8:31 AM MRN: [id number] Endoscopist: Mallie Mussel L. Loletha Carrow , MD Age: 56 Referring MD:  Date of Birth: 1965/08/19 Gender: Male Account #: [id number] Procedure:                Colonoscopy Indications:              Rectal bleeding (when bowel irregular), Change in                            bowel habits                           XRT for prostate CA in 2019 Medicines:                Monitored Anesthesia Care Procedure:                Pre-Anesthesia Assessment:                           - Prior to the procedure, a History and Physical                            was performed, and patient medications and                            allergies were reviewed. The patient's tolerance of                            previous anesthesia was also reviewed. The risks                            and benefits of the procedure and the sedation                            options and risks were discussed with the patient.                            All questions were answered, and informed consent                            was obtained. Prior Anticoagulants: The patient has                            taken no previous anticoagulant or antiplatelet                            agents. ASA Grade Assessment: II - A patient with                            mild systemic disease. After reviewing the risks                            and benefits, the patient was deemed in  satisfactory condition to undergo the procedure.                           After obtaining informed consent, the colonoscope                            was passed under direct vision. Throughout the                            procedure, the patient's blood pressure, pulse, and                            oxygen saturations were monitored continuously. The                            [id number] PCF-H190TL Slim SB Colonoscope was introduced                             through the anus and advanced to the the cecum,                            identified by appendiceal orifice and ileocecal                            valve. The colonoscopy was performed without                            difficulty. The patient tolerated the procedure                            well. The quality of the bowel preparation was                            good. The ileocecal valve, appendiceal orifice, and                            rectum were photographed. The bowel preparation                            used was SUPREP. Scope In: 8:36:31 AM Scope Out: 8:54:20 AM Scope Withdrawal Time: 0 hours 15 minutes 48 seconds  Total Procedure Duration: 0 hours 17 minutes 49 seconds  Findings:                 The perianal and digital rectal examinations were                            normal.                           Repeat examination of right colon under NBI                            performed.  A 6 mm polyp was found in the cecum. The polyp was                            semi-sessile. The polyp was removed with a cold                            snare. Resection and retrieval were complete.                           Four sessile and semi-sessile polyps were found in                            the transverse colon and ascending colon. The                            polyps were 2 to 8 mm in size. These polyps were                            removed with a cold snare. Resection and retrieval                            were complete.                           Multiple diverticula were found in the left colon.                           The mucosa vascular pattern in the distal rectum                            was mildly increased anteriorly (mild radiation                            proctopathy - see photos).                           The exam was otherwise without abnormality on                            direct and retroflexion views. Complications:             No immediate complications. Estimated Blood Loss:     Estimated blood loss: none. Estimated blood loss                            was minimal. Impression:               - One 6 mm polyp in the cecum, removed with a cold                            snare. Resected and retrieved.                           - Four 2 to 8 mm polyps in the transverse colon  and                            in the ascending colon, removed with a cold snare.                            Resected and retrieved.                           - Diverticulosis in the left colon.                           - Increased mucosa vascular pattern in the distal                            rectum.                           - The examination was otherwise normal on direct                            and retroflexion views. Recommendation:           - Patient has a contact number available for                            emergencies. The signs and symptoms of potential                            delayed complications were discussed with the                            patient. Return to normal activities tomorrow.                            Written discharge instructions were provided to the                            patient.                           - Resume previous diet.                           - Continue present medications.                           - Await pathology results.                           - Increase dietary fiber for regularity.                           - Repeat colonoscopy is recommended for                            surveillance. The colonoscopy date will be  determined after pathology results from today's                            exam become available for review.                           - See the other procedure note for documentation of                            additional recommendations. Jessie Cowher L. Loletha Carrow, MD 10/01/2021 9:13:46 AM This report has been signed  electronically.

## 2021-10-01 NOTE — Progress Notes (Signed)
855 Ephedrine 10 mg given IV due to low BP, MD updated.

## 2021-10-01 NOTE — Progress Notes (Signed)
Vs in adm by CW

## 2021-10-01 NOTE — Progress Notes (Signed)
History and Physical:  This patient presents for endoscopic testing for: Encounter Diagnoses  Name Primary?   Rectum bleeding Yes   Gastroesophageal reflux disease, unspecified whether esophagitis present    Clinical details in office consult note of 08/16/2021. Chronic reflux symptoms, bloating, nausea, altered bowel habits and occasional small-volume rectal bleeding Patient with daily alcohol use to excess. He also describes some sinus problems with postnasal drip causing morning mucus production, and says he will soon be seeing an ENT physician.  Patient is otherwise without complaints or active issues today.   Past Medical History: Past Medical History:  Diagnosis Date   Alcohol abuse    Anxiety    Arthritis    Cancer ([place])    Cataract    Depression    Prostate cancer [facility])      Past Surgical History: Past Surgical History:  Procedure Laterality Date   APPENDECTOMY     HERNIA REPAIR      Allergies: No Known Allergies  Outpatient Meds: Current Outpatient Medications  Medication Sig Dispense Refill   AMBULATORY NON FORMULARY MEDICATION Take 1 capsule by mouth daily. One a day men     dapagliflozin propanediol (FARXIGA) 10 MG TABS tablet Take by mouth daily.     diphenhydrAMINE (BENADRYL) 25 MG tablet Take 25 mg by mouth every 6 (six) hours as needed.     ergocalciferol (VITAMIN D2) 1.25 MG (50000 UT) capsule Take 50,000 Units by mouth once a week.     folic acid (FOLVITE) 932 MCG tablet Take 400 mcg by mouth daily.     NON FORMULARY Take 1 1e11 Vector Genomes by mouth 2 (two) times daily. Enteresto 24 mg     tadalafil (CIALIS) 20 MG tablet Take 20 mg by mouth daily.     Current Facility-Administered Medications  Medication Dose Route Frequency Provider Last Rate Last Admin   0.9 %  sodium chloride infusion  500 mL Intravenous Once Nelida Meuse III, MD          ___________________________________________________________________ Objective   Exam:  BP  108/84   Pulse 97   Temp (!) 97.1 F (36.2 C)   Ht '5\' 11"'$  (1.803 m)   Wt 136 lb (61.7 kg)   SpO2 98%   BMI 18.97 kg/m   CV: RRR without murmur, S1/S2 Resp: clear to auscultation bilaterally, normal RR and effort noted GI: soft, no tenderness, with active bowel sounds.  No distention or hepatosplenomegaly   Assessment: Encounter Diagnoses  Name Primary?   Rectum bleeding Yes   Gastroesophageal reflux disease, unspecified whether esophagitis present      Plan: Colonoscopy EGD  The benefits and risks of the planned procedure were described in detail with the patient or (when appropriate) their health care proxy.  Risks were outlined as including, but not limited to, bleeding, infection, perforation, adverse medication reaction leading to cardiac or pulmonary decompensation, pancreatitis (if ERCP).  The limitation of incomplete mucosal visualization was also discussed.  No guarantees or warranties were given.    The patient is appropriate for an endoscopic procedure in the ambulatory setting.   - Wilfrid Lund, MD

## 2021-10-01 NOTE — Progress Notes (Signed)
Report given to PACU, vss 

## 2021-10-02 ENCOUNTER — Telehealth: Payer: Self-pay

## 2021-10-02 NOTE — Telephone Encounter (Signed)
No answer, left message to call if having any issues or concerns, B.Takila Kronberg RN 

## 2021-10-03 ENCOUNTER — Encounter: Payer: Self-pay | Admitting: Gastroenterology

## 2021-10-07 ENCOUNTER — Other Ambulatory Visit: Payer: Self-pay

## 2021-10-07 MED ORDER — BISMUTH SUBSALICYLATE 262 MG PO TABS: 524.0000 mg | ORAL_TABLET | Freq: Four times a day (QID) | ORAL | 0 refills | Status: AC

## 2021-10-07 MED ORDER — METRONIDAZOLE 500 MG PO TABS: 500.0000 mg | ORAL_TABLET | Freq: Three times a day (TID) | ORAL | 0 refills | Status: AC

## 2021-10-07 MED ORDER — DOXYCYCLINE HYCLATE 100 MG PO CAPS: 100.0000 mg | ORAL_CAPSULE | Freq: Two times a day (BID) | ORAL | 0 refills | Status: AC

## 2021-11-15 NOTE — Progress Notes (Unsigned)
11/18/2021 Glenn Daniel [id number] [birth year]  Referring provider: Dr. Jannette Fogo  Primary GI doctor: Dr. Loletha Carrow  ASSESSMENT AND PLAN:   Assessment: 56 y.o. male here for assessment of the following: 1. Helicobacter pylori gastritis   2. History of adenomatous polyp of colon   3. Alternating constipation and diarrhea   4. Alcohol abuse    Colonoscopy showed bowel prep one 6 mm  polyp cecum, 4 polyps 2 to 8 mm in size transverse colon ascending colon, diverticulosis, increased mucosal vascular pattern in the distal rectum.  Precancerous polyps, recall 3 years. (10/2024) EGD showed h pylori gastritis  Has not drank in 2 weeks, doing well, follow up with PCP Likely this was contributing to alternating bowel habits and GERD,    Plan: Counseled on ETOH cessation, information given miralax and fiber daily Will complete ABX and be off PPI for at least 4 weeks, plan on returning stool H pylori in 6 weeks Did not take ABX exactly as discussed so may need another regimen.  Recall colon 10/2024  Orders Placed This Encounter  Procedures   Helicobacter pylori special antigen    Patient Care Team: [facility] as PCP - General (Pediatrics)  HISTORY OF PRESENT ILLNESS: 56 y.o. male with a past medical history of alcohol abuse, smoker, arthritis, prostate cancer s/p radiation therapy 2019, CKD stage 3, and others listed below presents for follow-up of colonoscopy and endoscopy.  08/16/2021 OV had chronic reflux, altered bowel habits occasional small-volume rectal bleeding. 10/01/2021 colonoscopy endoscopy with Dr. Loletha Carrow EGD showed esophagus, congested gastropathy, normal duodenum, positive H. pylori gastritis Colonoscopy showed bowel prep one 6 mm  polyp cecum, 4 polyps 2 to 8 mm in size transverse colon ascending colon, diverticulosis, increased mucosal vascular pattern in the distal rectum.  Precancerous polyps, recall 3 years. (10/2024)  Patient given  Flagyl 500 mg 3 times daily for 14 days, Pepto 525 4 times daily for 14 days, doxycycline 100 mg twice daily for 14 days and omeprazole 20 mg twice daily for 14 days, he is still on one of the antibiotics doxycycline, states he skipped a few days of flagyl, and is uncertain if he is on the pepto. States his stomach is feeling better.  He has not had any ETOH since last week, he denies tremors or withdrawal symptoms but has had some insomnia.  Denies AB pain, BM is daily and doing well.   10/09/2021 ER visit for fatigue/weakness, patient had normal vital signs COVID and flu negative, EKG normal, troponin negative, received 1 IVF bolus and Toradol symptoms improved.  Patient did have white blood cell count 3, hemoglobin 11.6, MCV 104, platelets 152. Patient had elevated iron and ferritin, hemochromatosis negative, B12 normal.  Wt Readings from Last 3 Encounters:  11/18/21 137 lb (62.1 kg)  10/01/21 136 lb (61.7 kg)  08/16/21 136 lb (61.7 kg)     Current Medications:   Current Outpatient Medications (Endocrine & Metabolic):    dapagliflozin propanediol (FARXIGA) 10 MG TABS tablet, Take by mouth daily. (Patient not taking: Reported on 11/18/2021)  Current Outpatient Medications (Cardiovascular):    fenofibrate (TRICOR) 145 MG tablet, Take 145 mg by mouth daily.   tadalafil (CIALIS) 20 MG tablet, Take 20 mg by mouth daily.  Current Outpatient Medications (Respiratory):    diphenhydrAMINE (BENADRYL) 25 MG tablet, Take 25 mg by mouth every 6 (six) hours as needed.   Current Outpatient Medications (Hematological):    folic acid (FOLVITE) 865 MCG tablet,  Take 400 mcg by mouth daily. (Patient not taking: Reported on 11/18/2021)  Current Outpatient Medications (Other):    AMBULATORY NON FORMULARY MEDICATION, Take 1 capsule by mouth daily. One a day men   ergocalciferol (VITAMIN D2) 1.25 MG (50000 UT) capsule, Take 50,000 Units by mouth once a week.   NON FORMULARY, Take 1 1e11 Vector Genomes by  mouth 2 (two) times daily. Enteresto 24 mg  Medical History:  Past Medical History:  Diagnosis Date   Alcohol abuse    Anxiety    Arthritis    Cancer ([place])    Cataract    Depression    Prostate cancer ([place])    Allergies: No Known Allergies   Surgical History:  He  has a past surgical history that includes Appendectomy and Hernia repair. Family History:  His family history includes Diabetes in his father; Prostate cancer in his brother. Social History:   reports that he has been smoking cigarettes. He has a 27.00 pack-year smoking history. He has quit using smokeless tobacco. No history on file for alcohol use and drug use.  REVIEW OF SYSTEMS  : All other systems reviewed and negative except where noted in the History of Present Illness.   PHYSICAL EXAM: BP 118/78   Pulse 82   Ht '5\' 11"'$  (1.803 m)   Wt 137 lb (62.1 kg)   BMI 19.11 kg/m  General:   Pleasant, skinny male in no acute distress Head:   Normocephalic and atraumatic. Eyes:  sclerae anicteric,conjunctive pink  Heart:   regular rate and rhythm Pulm:  Clear anteriorly; no wheezing Abdomen:   Soft, Flat AB, Active bowel sounds. mild tenderness in the epigastrium. Without guarding and Without rebound, No organomegaly appreciated. Rectal: Not evaluated Extremities:  Without edema. Msk: Symmetrical without gross deformities. Peripheral pulses intact.  Neurologic:  Alert and  oriented x4;  No focal deficits.  Skin:   Dry and intact without significant lesions or rashes. Psychiatric:  Cooperative. Normal mood and affect.    Vladimir Crofts, PA-C 2:37 PM

## 2021-11-18 ENCOUNTER — Encounter: Payer: Self-pay | Admitting: Physician Assistant

## 2021-11-18 ENCOUNTER — Ambulatory Visit (INDEPENDENT_AMBULATORY_CARE_PROVIDER_SITE_OTHER): Payer: 59 | Admitting: Physician Assistant

## 2021-11-18 VITALS — BP 118/78 | HR 82 | Ht 71.0 in | Wt 137.0 lb

## 2021-11-18 DIAGNOSIS — Z86010 Personal history of colonic polyps: Secondary | ICD-10-CM | POA: Diagnosis not present

## 2021-11-18 DIAGNOSIS — F1010 Alcohol abuse, uncomplicated: Secondary | ICD-10-CM

## 2021-11-18 DIAGNOSIS — B9681 Helicobacter pylori [H. pylori] as the cause of diseases classified elsewhere: Secondary | ICD-10-CM

## 2021-11-18 DIAGNOSIS — R198 Other specified symptoms and signs involving the digestive system and abdomen: Secondary | ICD-10-CM

## 2021-11-18 DIAGNOSIS — K2970 Gastritis, unspecified, without bleeding: Secondary | ICD-10-CM | POA: Diagnosis not present

## 2021-11-18 DIAGNOSIS — Z860101 Personal history of adenomatous and serrated colon polyps: Secondary | ICD-10-CM

## 2021-11-18 NOTE — Patient Instructions (Addendum)
_______________________________________________________  If you are age 57 or older, your body mass index should be between 23-30. Your Body mass index is 19.11 kg/m. If this is out of the aforementioned range listed, please consider follow up with your Primary Care Provider.  If you are age 53 or younger, your body mass index should be between 19-25. Your Body mass index is 19.11 kg/m. If this is out of the aformentioned range listed, please consider follow up with your Primary Care Provider.   ________________________________________________________  The [facility] providers would like to encourage you to use [facility] to communicate with providers for non-urgent requests or questions.  Due to long hold times on the telephone, sending your provider a message by [facility] may be a faster and more efficient way to get a response.  Please allow 48 business hours for a response.  Please remember that this is for non-urgent requests.  _______________________________________________________ Need you to finish the antibiotics and stop the omeprazole-6 weeks after this please provide and turn in stool samples. Address is [place] [address] on the [address]. The lab is located at the first door on the left as you exit the elevator. 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  It only brings more water into the stool.  This is safe to take daily.  Can take up to 17 gram of miralax twice a day.  Mix with juice or coffee.  Start 1 capful at night for 3-4 days and reassess your response in 3-4 days.  You can increase and decrease the dose based on your response.  Remember, it can take up to 3-4 days to take effect OR for the effects to wear off.   I often pair this with benefiber in the morning to help assure the stool is not too loose.   Toileting  tips to help with your constipation - Drink at least 64-80 ounces of water/liquid per day. - Establish a time to try to move your bowels every day.  For many people, this is after a cup of coffee or after a meal such as breakfast. - Sit all of the way back on the toilet keeping your back fairly straight and while sitting up, try to rest the tops of your forearms on your upper thighs.   - Raising your feet with a step stool/squatty potty can be helpful to improve the angle that allows your stool to pass through the rectum. - Relax the rectum feeling it bulge toward the toilet water.  If you feel your rectum raising toward your body, you are contracting rather than relaxing. - Breathe in and slowly exhale. "Belly breath" by expanding your belly towards your belly button. Keep belly expanded as you gently direct pressure down and back to the anus.  A low pitched GRRR sound can assist with increasing intra-abdominal pressure.  - Repeat 3-4 times. If unsuccessful, contract the pelvic floor to restore normal tone and get off the toilet.  Avoid excessive straining. - To reduce excessive wiping by teaching your anus to normally contract, place hands on outer aspect of knees and resist knee movement outward.  Hold 5-10 second then place hands just inside of knees and resist inward movement of knees.  Hold 5 seconds.  Repeat a few times each way.   Helicobacter Pylori Infection Helicobacter pylori infection is a  bacterial infection in the stomach. Long-term (chronic) infection can cause stomach irritation (gastritis), ulcers in the stomach (gastric ulcers), and ulcers in the upper part of the intestine (duodenal ulcers). Having this infection may also increase your risk of stomach cancer and a type of white blood cell cancer (lymphoma) that affects the stomach. What are the causes? This infection is caused by the Helicobacter pylori (H. pylori) bacteria. Many healthy people have this bacteria in their stomach  lining. The bacteria may also spread from person to person through contact with stool (feces) or saliva. It is not known why some people develop ulcers, gastritis, or cancer from the bacteria. What increases the risk? You are more likely to develop this condition if you: Have family members with the infection. Live with many other people, such as in a dormitory. What are the signs or symptoms? Most people with this infection do not have any symptoms. If you do have symptoms, they may include: Heartburn. Stomach pain. Nausea. Vomiting. The vomit may be bloody because of ulcers. Loss of appetite. Bad breath. How is this diagnosed? This condition may be diagnosed based on: Your symptoms and medical history. A physical exam. Blood tests. Stool tests. A breath test. A procedure that involves placing a tube with a camera on the end of it down your throat to examine your stomach and upper intestine (upper endoscopy). Removing and testing a tissue sample from the stomach lining (biopsy). A biopsy may be taken during an upper endoscopy. How is this treated?  This condition is treated by taking a combination of medicines (triple therapy) for several weeks. Triple therapy includes one medicine to reduce the amount of acid in your stomach and two types of antibiotic medicines. This treatment may reduce your risk of cancer. You may need to be tested for H. pylori again after treatment. In some cases, the treatment may need to be repeated if your treatment did not get rid of all the bacteria. Follow these instructions at home:  Take over-the-counter and prescription medicines only as told by your health care provider. Take your antibiotic medicine as told by your health care provider. Do not stop taking the antibiotics even if you start to feel better. Return to your normal activities as told by your health care provider. Ask your health care provider what activities are safe for you. Take steps to  prevent future infections: Wash your hands often with soap and water for at least 20 seconds. If soap and water are not available, use hand sanitizer. Do not eat food or drink water that may have had contact with stool or saliva. Keep all follow-up visits. This is important. You may need tests to make sure your treatment worked. Contact a health care provider if your symptoms: Do not get better with treatment. Return after treatment. Summary Helicobacter pylori infection is a stomach infection caused by the Helicobacter pylori (H. pylori) bacteria. This infection can cause stomach irritation (gastritis), ulcers in the stomach (gastric ulcers), and ulcers in the upper part of the intestine (duodenal ulcers). This condition is treated by taking a combination of medicines (triple therapy) for several weeks. Take your antibiotic medicine as told by your health care provider. Do not stop taking the antibiotics even if you start to feel better. This information is not intended to replace advice given to you by your health care provider. Make sure you discuss any questions you have with your health care provider. Document Revised: 09/05/2020 Document Reviewed: 09/05/2020 Else[employer] Patient Education  2023 [employer]  

## 2021-11-19 NOTE — Progress Notes (Signed)
____________________________________________________________  Attending physician addendum:  Thank you for sending this case to me. I have reviewed the entire note and agree with the plan with the following minor change:  The new Diatherix stool antigen for H pylori can be done 2 weeks after completing Abx, and it still accurate if patients need to remain on PPI when the test is done.  Wilfrid Lund, MD  ____________________________________________________________

## 2021-12-30 ENCOUNTER — Other Ambulatory Visit: Payer: 59

## 2022-01-20 ENCOUNTER — Other Ambulatory Visit: Payer: 59

## 2022-01-20 DIAGNOSIS — B9681 Helicobacter pylori [H. pylori] as the cause of diseases classified elsewhere: Secondary | ICD-10-CM

## 2022-01-22 LAB — HELICOBACTER PYLORI  SPECIAL ANTIGEN
MICRO NUMBER:: 14212594
SPECIMEN QUALITY: ADEQUATE
